# Patient Record
Sex: Female | Born: 1995 | Hispanic: Yes | Marital: Single | Smoking: Never smoker
Health system: Southern US, Community
[De-identification: ages and names within clinical notes are randomized; demographics above are authoritative.]

## PROBLEM LIST (undated history)

## (undated) DIAGNOSIS — G43909 Migraine, unspecified, not intractable, without status migrainosus: Secondary | ICD-10-CM

---

## 2020-03-23 ENCOUNTER — Emergency Department (HOSPITAL_BASED_OUTPATIENT_CLINIC_OR_DEPARTMENT_OTHER): Payer: 59

## 2020-03-23 ENCOUNTER — Encounter (HOSPITAL_BASED_OUTPATIENT_CLINIC_OR_DEPARTMENT_OTHER): Payer: Self-pay | Admitting: Emergency Medicine

## 2020-03-23 ENCOUNTER — Emergency Department (HOSPITAL_BASED_OUTPATIENT_CLINIC_OR_DEPARTMENT_OTHER)
Admission: EM | Admit: 2020-03-23 | Discharge: 2020-03-23 | Disposition: A | Discharge status: Home / Self Care | Payer: 59 | Attending: Emergency Medicine | Admitting: Emergency Medicine

## 2020-03-23 DIAGNOSIS — R0789 Other chest pain: Secondary | ICD-10-CM | POA: Insufficient documentation

## 2020-03-23 DIAGNOSIS — S0001XA Abrasion of scalp, initial encounter: Secondary | ICD-10-CM | POA: Diagnosis not present

## 2020-03-23 DIAGNOSIS — R519 Headache, unspecified: Secondary | ICD-10-CM | POA: Insufficient documentation

## 2020-03-23 DIAGNOSIS — M542 Cervicalgia: Secondary | ICD-10-CM | POA: Insufficient documentation

## 2020-03-23 DIAGNOSIS — S0000XA Unspecified superficial injury of scalp, initial encounter: Secondary | ICD-10-CM | POA: Diagnosis present

## 2020-03-23 HISTORY — DX: Migraine, unspecified, not intractable, without status migrainosus: G43.909

## 2020-03-23 LAB — PREGNANCY, URINE: Preg Test, Ur: NEGATIVE

## 2020-03-23 MED ORDER — ONDANSETRON 4 MG PO TBDP: 4.0000 mg | ORAL_TABLET | Freq: Three times a day (TID) | ORAL | 0 refills | Status: DC | PRN

## 2020-03-23 MED ORDER — ONDANSETRON 4 MG PO TBDP
4.0000 mg | ORAL_TABLET | Freq: Once | ORAL | Status: AC
  Administered 2020-03-23: 4 mg via ORAL
  Filled 2020-03-23: qty 1

## 2020-03-23 MED ORDER — OXYCODONE-ACETAMINOPHEN 5-325 MG PO TABS
1.0000 | ORAL_TABLET | Freq: Once | ORAL | Status: AC
  Administered 2020-03-23: 1 via ORAL
  Filled 2020-03-23: qty 1

## 2020-03-23 NOTE — Discharge Instructions (Signed)
Take Tylenol or Motrin for pain control.  Take Zofran as needed for nausea.  If you develop worsening vomiting, abdominal pain, difficulty breathing or other new concerning symptom, return to ER for reassessment.
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
## 2022-02-09 ENCOUNTER — Encounter: Payer: Self-pay | Admitting: Emergency Medicine

## 2022-02-09 ENCOUNTER — Other Ambulatory Visit: Payer: Self-pay

## 2022-02-09 ENCOUNTER — Ambulatory Visit
Admission: EM | Admit: 2022-02-09 | Discharge: 2022-02-09 | Disposition: A | Discharge status: Home / Self Care | Payer: BLUE CROSS/BLUE SHIELD | Attending: Physician Assistant | Admitting: Physician Assistant

## 2022-02-09 DIAGNOSIS — J069 Acute upper respiratory infection, unspecified: Secondary | ICD-10-CM | POA: Insufficient documentation

## 2022-02-09 DIAGNOSIS — U071 COVID-19: Secondary | ICD-10-CM | POA: Diagnosis not present

## 2022-02-09 LAB — SARS CORONAVIRUS 2 (TAT 6-24 HRS): SARS Coronavirus 2: POSITIVE — AB

## 2022-02-09 NOTE — ED Triage Notes (Signed)
Pt here for cough, nasal congestion with body aches x 2 days

## 2022-02-09 NOTE — ED Provider Notes (Signed)
[facility]    CSN: [id number] Arrival date & time: 02/09/22  1607      History   Chief Complaint Chief Complaint  Patient presents with   Cough    HPI Casey Herrera is a 26 y.o. female.   Patient here today for evaluation of cough, nasal congestion, body aches and fever that started 2 days ago. She reports some sore throat. She has been taking dayquil with minimal relief. She has not had any nausea, vomiting or diarrhea.   The history is provided by the patient.  Cough Associated symptoms: fever, myalgias and sore throat   Associated symptoms: no eye discharge and no shortness of breath     Past Medical History:  Diagnosis Date   Migraines     There are no problems to display for this patient.   History reviewed. No pertinent surgical history.  OB History   No obstetric history on file.      Home Medications    Prior to Admission medications   Medication Sig Start Date End Date Taking? Authorizing Provider  ondansetron (ZOFRAN ODT) 4 MG disintegrating tablet Take 1 tablet (4 mg total) by mouth every 8 (eight) hours as needed for nausea or vomiting. 03/23/20   Milagros Loll, MD    Family History History reviewed. No pertinent family history.  Social History Social History   Tobacco Use   Smoking status: Never   Smokeless tobacco: Never  Substance Use Topics   Alcohol use: Yes   Drug use: Never     Allergies   Patient has no known allergies.   Review of Systems Review of Systems  Constitutional:  Positive for fever.  HENT:  Positive for sore throat. Negative for congestion.   Eyes:  Negative for discharge and redness.  Respiratory:  Positive for cough. Negative for shortness of breath.   Gastrointestinal:  Negative for diarrhea, nausea and vomiting.  Musculoskeletal:  Positive for myalgias.     Physical Exam Triage Vital Signs ED Triage Vitals  Enc Vitals Group     BP 02/09/22 1716 125/78     Pulse Rate  02/09/22 1716 85     Resp 02/09/22 1716 18     Temp 02/09/22 1716 99.8 F (37.7 C)     Temp Source 02/09/22 1716 Oral     SpO2 02/09/22 1716 96 %     Weight --      Height --      Head Circumference --      Peak Flow --      Pain Score 02/09/22 1717 5     Pain Loc --      Pain Edu? --      Excl. in [place]? --    No data found.  Updated Vital Signs BP 125/78 (BP Location: Left Arm)   Pulse 85   Temp 99.8 F (37.7 C) (Oral)   Resp 18   SpO2 96%      Physical Exam Vitals and nursing note reviewed.  Constitutional:      General: She is not in acute distress.    Appearance: Normal appearance. She is not ill-appearing.  HENT:     Head: Normocephalic and atraumatic.     Nose: Congestion present.     Mouth/Throat:     Pharynx: No posterior oropharyngeal erythema.  Eyes:     Conjunctiva/sclera: Conjunctivae normal.  Cardiovascular:     Rate and Rhythm: Normal rate and regular rhythm.     Heart  sounds: Normal heart sounds.  Pulmonary:     Effort: Pulmonary effort is normal. No respiratory distress.     Breath sounds: Normal breath sounds. No wheezing, rhonchi or rales.  Neurological:     Mental Status: She is alert.  Psychiatric:        Mood and Affect: Mood normal.        Behavior: Behavior normal.      UC Treatments / Results  Labs (all labs ordered are listed, but only abnormal results are displayed) Labs Reviewed  SARS CORONAVIRUS 2 (TAT 6-24 HRS)    EKG   Radiology No results found.  Procedures Procedures (including critical care time)  Medications Ordered in UC Medications - No data to display  Initial Impression / Assessment and Plan / UC Course  I have reviewed the triage vital signs and the nursing notes.  Pertinent labs & imaging results that were available during my care of the patient were reviewed by me and considered in my medical decision making (see chart for details).    Suspect viral etiology of symptoms. Will order covid screening.  Encouraged symptomatic treatment, increased fluids and rest. Recommended follow up if symptoms do not improve or worsen in any way.   Final Clinical Impressions(s) / UC Diagnoses   Final diagnoses:  Acute upper respiratory infection   Discharge Instructions   None    ED Prescriptions   None    PDMP not reviewed this encounter.   Tomi Bamberger, PA-C 02/09/22 1758

## 2022-08-21 ENCOUNTER — Ambulatory Visit
Admission: EM | Admit: 2022-08-21 | Discharge: 2022-08-21 | Disposition: A | Discharge status: Home / Self Care | Payer: BC Managed Care – PPO

## 2022-08-21 DIAGNOSIS — K921 Melena: Secondary | ICD-10-CM | POA: Diagnosis not present

## 2022-08-21 NOTE — ED Triage Notes (Signed)
Patient c/o rectal pain that began yesterday and rectal bleeding that began today. She states she noticed blood in stool and in the toilet.

## 2022-08-21 NOTE — ED Provider Notes (Signed)
[facility]    CSN: [id number] Arrival date & time: 08/21/22  1456      History   Chief Complaint Chief Complaint  Patient presents with   Rectal Bleeding    HPI Casey Herrera is a 27 y.o. female presents for evaluation of rectal bleeding.  Patient reports today she had a bowel movement and noticed bright red blood in the toilet.  Also noticed blood from the rectum when wiping.  This is the first time this is occurred for her.  She denies any black tarry stools, abdominal pain, fevers or chills.  Denies history of hemorrhoids.  Denies constipation or straining.  She is not on blood thinning medications.  No other concerns at this time.   Rectal Bleeding   Past Medical History:  Diagnosis Date   Migraines     There are no problems to display for this patient.   History reviewed. No pertinent surgical history.  OB History   No obstetric history on file.      Home Medications    Prior to Admission medications   Medication Sig Start Date End Date Taking? Authorizing Provider  ondansetron (ZOFRAN ODT) 4 MG disintegrating tablet Take 1 tablet (4 mg total) by mouth every 8 (eight) hours as needed for nausea or vomiting. 03/23/20   Lucrezia Starch, MD    Family History History reviewed. No pertinent family history.  Social History Social History   Tobacco Use   Smoking status: Never   Smokeless tobacco: Never  Substance Use Topics   Alcohol use: Yes   Drug use: Never     Allergies   Patient has no known allergies.   Review of Systems Review of Systems  Gastrointestinal:  Positive for blood in stool and hematochezia.     Physical Exam Triage Vital Signs ED Triage Vitals  Enc Vitals Group     BP 08/21/22 1537 125/81     Pulse Rate 08/21/22 1537 75     Resp 08/21/22 1537 16     Temp 08/21/22 1537 98.2 F (36.8 C)     Temp Source 08/21/22 1537 Oral     SpO2 08/21/22 1537 98 %     Weight --      Height --      Head  Circumference --      Peak Flow --      Pain Score 08/21/22 1536 3     Pain Loc --      Pain Edu? --      Excl. in [place]? --    No data found.  Updated Vital Signs BP 125/81 (BP Location: Left Arm)   Pulse 75   Temp 98.2 F (36.8 C) (Oral)   Resp 16   LMP 08/13/2022   SpO2 98%   Visual Acuity Right Eye Distance:   Left Eye Distance:   Bilateral Distance:    Right Eye Near:   Left Eye Near:    Bilateral Near:     Physical Exam Vitals and nursing note reviewed. Chaperone present: Adriana CMA.  Constitutional:      Appearance: Normal appearance.  HENT:     Head: Normocephalic and atraumatic.  Eyes:     Pupils: Pupils are equal, round, and reactive to light.  Cardiovascular:     Rate and Rhythm: Normal rate.  Pulmonary:     Effort: Pulmonary effort is normal.  Genitourinary:    Comments: No fissures, external hemorrhoids.  No internal hemorrhoids with digital exam. Skin:  General: Skin is warm and dry.  Neurological:     General: No focal deficit present.     Mental Status: She is alert and oriented to person, place, and time.  Psychiatric:        Mood and Affect: Mood normal.        Behavior: Behavior normal.      UC Treatments / Results  Labs (all labs ordered are listed, but only abnormal results are displayed) Labs Reviewed - No data to display  EKG   Radiology No results found.  Procedures Procedures (including critical care time)  Medications Ordered in UC Medications - No data to display  Initial Impression / Assessment and Plan / UC Course  I have reviewed the triage vital signs and the nursing notes.  Pertinent labs & imaging results that were available during my care of the patient were reviewed by me and considered in my medical decision making (see chart for details).     Reviewed with patient symptoms and exam.  Unclear cause of blood in stool. Advised she establish with a PCP or follow-up with GI for further evaluation and and  testing. Encouraged fiber and to not strain for bowel movements ER precautions discussed and she is to go there if she develops any worsening symptoms prior to seeing a PCP or GI and patient verbalized understanding Final Clinical Impressions(s) / UC Diagnoses   Final diagnoses:  None   Discharge Instructions   None    ED Prescriptions   None    PDMP not reviewed this encounter.   Melynda Ripple, NP 08/21/22 [phone]

## 2022-08-21 NOTE — Discharge Instructions (Addendum)
Please follow-up with your PCP or gastroenterology for further evaluation of your symptoms Increase your fiber and avoid straining Please go to the ER if you have any worsening symptoms that occur prior to seeing a PCP or GI

## 2022-09-21 ENCOUNTER — Ambulatory Visit
Admission: EM | Admit: 2022-09-21 | Discharge: 2022-09-21 | Disposition: A | Discharge status: Home / Self Care | Payer: BC Managed Care – PPO | Attending: Urgent Care | Admitting: Urgent Care

## 2022-09-21 DIAGNOSIS — R197 Diarrhea, unspecified: Secondary | ICD-10-CM

## 2022-09-21 DIAGNOSIS — A084 Viral intestinal infection, unspecified: Secondary | ICD-10-CM

## 2022-09-21 DIAGNOSIS — R112 Nausea with vomiting, unspecified: Secondary | ICD-10-CM

## 2022-09-21 LAB — POCT URINE PREGNANCY: Preg Test, Ur: NEGATIVE

## 2022-09-21 MED ORDER — LOPERAMIDE HCL 2 MG PO CAPS: 2.0000 mg | ORAL_CAPSULE | Freq: Two times a day (BID) | ORAL | 0 refills | Status: AC | PRN

## 2022-09-21 MED ORDER — ONDANSETRON HCL 4 MG/2ML IJ SOLN
4.0000 mg | Freq: Once | INTRAMUSCULAR | Status: AC
  Administered 2022-09-21: 4 mg via INTRAMUSCULAR

## 2022-09-21 MED ORDER — ONDANSETRON 8 MG PO TBDP: 8.0000 mg | ORAL_TABLET | Freq: Three times a day (TID) | ORAL | 0 refills | Status: AC | PRN

## 2022-09-21 NOTE — ED Triage Notes (Signed)
Pt reports chills, body aches, diarrhea, vomiting x 1 day after eating [facility].

## 2022-09-21 NOTE — ED Provider Notes (Signed)
[facility]  Note:  This document was prepared using Conservation officer, historic buildings and may include unintentional dictation errors.  MRN: [id number] DOB: 28-Mar-1996  Subjective:   Casey Herrera is a 27 y.o. female presenting for 1 day history of persistent diarrhea, nausea, vomiting, chills, bilateral thigh pain.  Symptoms started after she ate [employer]. No fever, recent bloody stools, recent antibiotic use, hospitalizations or long distance travel.  Has not eaten raw foods, drank unfiltered water.  No history of GI disorders including Crohn's, IBS, ulcerative colitis.  Patient would like to be checked for pregnancy as well.  She has been tolerating liquids but is having more difficulty with foods.  No current facility-administered medications for this encounter.  Current Outpatient Medications:    ondansetron (ZOFRAN ODT) 4 MG disintegrating tablet, Take 1 tablet (4 mg total) by mouth every 8 (eight) hours as needed for nausea or vomiting., Disp: 20 tablet, Rfl: 0   No Known Allergies  Past Medical History:  Diagnosis Date   Migraines      History reviewed. No pertinent surgical history.  History reviewed. No pertinent family history.  Social History   Tobacco Use   Smoking status: Never   Smokeless tobacco: Never  Vaping Use   Vaping Use: Never used  Substance Use Topics   Alcohol use: Not Currently   Drug use: Never    ROS   Objective:   Vitals: BP 123/83 (BP Location: Right Arm)   Pulse 89   Temp 99.2 F (37.3 C) (Oral)   Resp 18   LMP 09/07/2022 (Exact Date)   SpO2 97%   Physical Exam Constitutional:      General: She is not in acute distress.    Appearance: Normal appearance. She is well-developed. She is not ill-appearing, toxic-appearing or diaphoretic.  HENT:     Head: Normocephalic and atraumatic.     Nose: Nose normal.     Mouth/Throat:     Mouth: Mucous membranes are moist.  Eyes:     General: No  scleral icterus.       Right eye: No discharge.        Left eye: No discharge.     Extraocular Movements: Extraocular movements intact.     Conjunctiva/sclera: Conjunctivae normal.  Cardiovascular:     Rate and Rhythm: Normal rate.  Pulmonary:     Effort: Pulmonary effort is normal.  Abdominal:     General: Bowel sounds are increased. There is no distension.     Palpations: Abdomen is soft. There is no mass.     Tenderness: There is abdominal tenderness. There is no right CVA tenderness, left CVA tenderness, guarding or rebound.  Skin:    General: Skin is warm and dry.  Neurological:     General: No focal deficit present.     Mental Status: She is alert and oriented to person, place, and time.  Psychiatric:        Mood and Affect: Mood normal.        Behavior: Behavior normal.        Thought Content: Thought content normal.        Judgment: Judgment normal.    Results for orders placed or performed during the hospital encounter of 09/21/22 (from the past 24 hour(s))  POCT urine pregnancy     Status: None   Collection Time: 09/21/22  5:24 PM  Result Value Ref Range   Preg Test, Ur Negative Negative    IM  Zofran 4mg  in clinic.   Assessment and Plan :   PDMP not reviewed this encounter.  1. Viral gastroenteritis   2. Nausea vomiting and diarrhea      Will manage for suspected viral gastroenteritis with supportive care.  Recommended patient hydrate well, eat light meals and maintain electrolytes.  Will use Zofran and Imodium for nausea, vomiting and diarrhea. Counseled patient on potential for adverse effects with medications prescribed/recommended today, ER and return-to-clinic precautions discussed, patient verbalized understanding.    Wallis Bamberg, [place] 09/21/22 1734

## 2022-09-21 NOTE — Discharge Instructions (Signed)

## 2023-07-27 ENCOUNTER — Emergency Department (HOSPITAL_BASED_OUTPATIENT_CLINIC_OR_DEPARTMENT_OTHER)
Admission: EM | Admit: 2023-07-27 | Discharge: 2023-07-27 | Disposition: A | Discharge status: Home / Self Care | Payer: Managed Care, Other (non HMO) | Attending: Emergency Medicine | Admitting: Emergency Medicine

## 2023-07-27 ENCOUNTER — Other Ambulatory Visit: Payer: Self-pay

## 2023-07-27 ENCOUNTER — Encounter (HOSPITAL_BASED_OUTPATIENT_CLINIC_OR_DEPARTMENT_OTHER): Payer: Self-pay | Admitting: Emergency Medicine

## 2023-07-27 DIAGNOSIS — Z79899 Other long term (current) drug therapy: Secondary | ICD-10-CM | POA: Diagnosis not present

## 2023-07-27 DIAGNOSIS — G43909 Migraine, unspecified, not intractable, without status migrainosus: Secondary | ICD-10-CM | POA: Insufficient documentation

## 2023-07-27 DIAGNOSIS — Z20822 Contact with and (suspected) exposure to covid-19: Secondary | ICD-10-CM | POA: Insufficient documentation

## 2023-07-27 DIAGNOSIS — R519 Headache, unspecified: Secondary | ICD-10-CM | POA: Diagnosis present

## 2023-07-27 LAB — RESP PANEL BY RT-PCR (RSV, FLU A&B, COVID)  RVPGX2
Influenza A by PCR: NEGATIVE
Influenza B by PCR: NEGATIVE
Resp Syncytial Virus by PCR: NEGATIVE
SARS Coronavirus 2 by RT PCR: NEGATIVE

## 2023-07-27 MED ORDER — KETOROLAC TROMETHAMINE 15 MG/ML IJ SOLN
15.0000 mg | Freq: Once | INTRAMUSCULAR | Status: AC
  Administered 2023-07-27: 15 mg via INTRAVENOUS
  Filled 2023-07-27: qty 1

## 2023-07-27 MED ORDER — ONDANSETRON HCL 4 MG/2ML IJ SOLN
4.0000 mg | Freq: Once | INTRAMUSCULAR | Status: AC
  Administered 2023-07-27: 4 mg via INTRAVENOUS
  Filled 2023-07-27: qty 2

## 2023-07-27 MED ORDER — DEXAMETHASONE SODIUM PHOSPHATE 10 MG/ML IJ SOLN
10.0000 mg | Freq: Once | INTRAMUSCULAR | Status: AC
  Administered 2023-07-27: 10 mg via INTRAVENOUS
  Filled 2023-07-27: qty 1

## 2023-07-27 MED ORDER — DIPHENHYDRAMINE HCL 50 MG/ML IJ SOLN
25.0000 mg | Freq: Once | INTRAMUSCULAR | Status: AC
  Administered 2023-07-27: 25 mg via INTRAVENOUS
  Filled 2023-07-27: qty 1

## 2023-07-27 MED ORDER — PROCHLORPERAZINE EDISYLATE 10 MG/2ML IJ SOLN
10.0000 mg | Freq: Once | INTRAMUSCULAR | Status: AC
  Administered 2023-07-27: 10 mg via INTRAVENOUS

## 2023-07-27 NOTE — ED Provider Notes (Signed)
[place] [facility] Provider Note   CSN: [id number] Arrival date & time: 07/27/23  1242     History  Chief Complaint  Patient presents with   Migraine    Casey Herrera is a 28 y.o. female with a past medical history of migraines presents to emergency department for evaluation of headache that starts at the top of the head and goes into the back of her head.  She reports this is similar location of previous migraines however it is getting worse.  She has tried sumatriptan and Tylenol but has not helped with migraine.  She has associated nausea, photophobia, sound sensitivity, and blurred vision in both eyes.  She denies recent falls, syncope, seizures  Of note, she was diagnosed and treated with Augmentin for a sinus infection by her PCP but otherwise has been well   Migraine Associated symptoms include headaches. Pertinent negatives include no chest pain, no abdominal pain and no shortness of breath.      Home Medications Prior to Admission medications   Medication Sig Start Date End Date Taking? Authorizing Provider  loperamide (IMODIUM) 2 MG capsule Take 1 capsule (2 mg total) by mouth 2 (two) times daily as needed for diarrhea or loose stools. 09/21/22   Wallis Bamberg, PA-C  ondansetron (ZOFRAN-ODT) 8 MG disintegrating tablet Take 1 tablet (8 mg total) by mouth every 8 (eight) hours as needed for nausea or vomiting. 09/21/22   Wallis Bamberg, PA-C      Allergies    Patient has no known allergies.    Review of Systems   Review of Systems  Constitutional:  Negative for chills, fatigue and fever.  Respiratory:  Negative for cough, chest tightness, shortness of breath and wheezing.   Cardiovascular:  Negative for chest pain and palpitations.  Gastrointestinal:  Negative for abdominal pain, constipation, diarrhea, nausea and vomiting.  Neurological:  Positive for headaches. Negative for dizziness, seizures, weakness, light-headedness and  numbness.    Physical Exam Updated Vital Signs BP 114/84   Pulse 78   Temp 98.5 F (36.9 C) (Oral)   Resp 15   Wt 108.9 kg   LMP 07/19/2023   SpO2 98%   BMI 36.49 kg/m  Physical Exam Vitals and nursing note reviewed.  Constitutional:      General: She is not in acute distress.    Appearance: Normal appearance. She is not ill-appearing.  HENT:     Head: Normocephalic and atraumatic.     Right Ear: Tympanic membrane, ear canal and external ear normal.     Left Ear: Tympanic membrane, ear canal and external ear normal.     Mouth/Throat:     Mouth: Mucous membranes are moist.     Pharynx: No oropharyngeal exudate or posterior oropharyngeal erythema.     Comments: Uvula midline. No abscess, fluctuance, erythema noted in mouth Eyes:     General: No scleral icterus.       Right eye: No discharge.        Left eye: No discharge.     Extraocular Movements:     Right eye: Normal extraocular motion and no nystagmus.     Left eye: Normal extraocular motion and no nystagmus.     Conjunctiva/sclera: Conjunctivae normal.     Right eye: Right conjunctiva is not injected. No chemosis, exudate or hemorrhage.    Left eye: Left conjunctiva is not injected. No chemosis, exudate or hemorrhage.    Comments: Initially, endorses mild photophobia and bilateral blurry  vision however following migraine cocktail, these are resolved  Cardiovascular:     Rate and Rhythm: Normal rate.     Pulses: Normal pulses.  Pulmonary:     Effort: Pulmonary effort is normal. No respiratory distress.     Breath sounds: Normal breath sounds. No stridor. No wheezing or rhonchi.  Chest:     Chest wall: No tenderness.  Abdominal:     General: There is no distension.     Palpations: Abdomen is soft. There is no mass.     Tenderness: There is no abdominal tenderness. There is no guarding.  Musculoskeletal:     Cervical back: Normal range of motion and neck supple. No rigidity or tenderness.  Lymphadenopathy:      Cervical: No cervical adenopathy.  Skin:    General: Skin is warm.     Capillary Refill: Capillary refill takes less than 2 seconds.     Coloration: Skin is not jaundiced or pale.  Neurological:     General: No focal deficit present.     Mental Status: She is alert and oriented to person, place, and time. Mental status is at baseline.     Cranial Nerves: No cranial nerve deficit.     Sensory: No sensory deficit.     Motor: No weakness.     Coordination: Coordination normal.     Gait: Gait normal.     Deep Tendon Reflexes: Reflexes normal.     Comments: Acting appropriately.  Ambulates without difficulty.  Cranial nerves II-XII are intact.  There is no sensory deficit, weakness, nor abnormal gait.       ED Results / Procedures / Treatments   Labs (all labs ordered are listed, but only abnormal results are displayed) Labs Reviewed  RESP PANEL BY RT-PCR (RSV, FLU A&B, COVID)  [id number]    EKG None  Radiology No results found.  Procedures Procedures    Medications Ordered in ED Medications  prochlorperazine (COMPAZINE) injection 10 mg (10 mg Intravenous Given 07/27/23 1611)  diphenhydrAMINE (BENADRYL) injection 25 mg (25 mg Intravenous Given 07/27/23 1611)  ketorolac (TORADOL) 15 MG/ML injection 15 mg (15 mg Intravenous Given 07/27/23 1612)  dexamethasone (DECADRON) injection 10 mg (10 mg Intravenous Given 07/27/23 1612)  ondansetron (ZOFRAN) injection 4 mg (4 mg Intravenous Given 07/27/23 1612)    ED Course/ Medical Decision Making/ A&P                                 Medical Decision Making Risk Prescription drug management.   Patient presents to the ED for concern of headache, this involves an extensive number of treatment options, and is a complaint that carries with it a high risk of complications and morbidity.  The differential diagnosis includes COVID, flu, RSV, migraine exacerbation, less likely trauma, ICH, SAH   Co morbidities that complicate the patient  evaluation  Has medical history of migraines that have not been well-controlled since December   Additional history obtained:  Additional history obtained from Nursing and Outside Medical Records   External records from outside source obtained and reviewed including Triage RN note PCP visit from 06/01/2023   Lab Tests:  I Ordered, and personally interpreted labs.  The pertinent results include: Respiratory panel negative    Medicines ordered and prescription drug management:  I ordered medication including Zofran, Decadron, Toradol, Benadryl, Compazine for migraine Reevaluation of the patient after these medicines showed that the patient resolved I  have reviewed the patients home medicines and have made adjustments as needed     Problem List / ED Course:  Migraine Migraine is similar to migraines in the past.  Associated symptoms completely resolved with migraine cocktail. I have low suspicion for ICH, SAH as patient had no focal deficits, trauma and was not described as worst headache of her life She also appears to have had worsening headaches since 06/01/2023.  There is a PCP note where she reports that her are not being controlled with sumatriptan as well Discussed ED workup, disposition, return to emerged part precautions with patient expresses understand agrees with plan.  All questions answered to her satisfaction.  She is agreeable discharge.  Discharge instructions provided on discharge paperwork. She will follow-up with PCP regarding further management of headaches   Reevaluation:  After the interventions noted above, I reevaluated the patient and found that they have :resolved   Social Determinants of Health:  Has PCP follow-up   Dispostion:  After consideration of the diagnostic results and the patients response to treatment, I feel that the patent would benefit from outpatient management.    Final Clinical Impression(s) / ED Diagnoses Final  diagnoses:  Migraine without status migrainosus, not intractable, unspecified migraine type    Rx / DC Orders ED Discharge Orders     None         Judithann Sheen, PA 07/27/23 1714    Virgina Norfolk, DO 07/27/23 1745

## 2023-07-27 NOTE — ED Triage Notes (Signed)
Headache migraine x 10 days , URI 2 weeks ago . Took her migraine med with no relief . No obvious distress . Denies NV . Dizzy . No facial pressure .

## 2023-07-27 NOTE — ED Notes (Signed)
Pt is having severe headache in waiting area.  Alert and oriented but visibly uncomfortable.

## 2023-07-27 NOTE — Discharge Instructions (Signed)
Thank you for letting us evaluate you today.  We broke your migraine with Compazine, Toradol, steroid.  Please make sure to follow-up with PCP and let them know that you had to seek ED evaluation for your migraines.  Possibly may need to change up medications or management.  Please return to emergency department if you experience worsening headache, head trauma, altered mentation, seizures
